# Patient Record
Sex: Female | Born: 1960 | Race: White | Hispanic: No | Marital: Married | Smoking: Never smoker
Health system: Southern US, Community
[De-identification: ages and names within clinical notes are randomized; demographics above are authoritative.]

## PROBLEM LIST (undated history)

## (undated) DIAGNOSIS — K219 Gastro-esophageal reflux disease without esophagitis: Secondary | ICD-10-CM

## (undated) DIAGNOSIS — G43909 Migraine, unspecified, not intractable, without status migrainosus: Secondary | ICD-10-CM

## (undated) DIAGNOSIS — M542 Cervicalgia: Secondary | ICD-10-CM

## (undated) HISTORY — PX: APPENDECTOMY: SHX54

---

## 1997-12-12 ENCOUNTER — Other Ambulatory Visit: Admission: RE | Admit: 1997-12-12 | Discharge: 1997-12-12 | Payer: Self-pay | Admitting: *Deleted

## 1997-12-12 ENCOUNTER — Other Ambulatory Visit: Admission: RE | Admit: 1997-12-12 | Discharge: 1997-12-12 | Payer: Self-pay | Admitting: Obstetrics and Gynecology

## 2009-07-14 ENCOUNTER — Encounter: Admission: RE | Admit: 2009-07-14 | Discharge: 2009-07-14 | Payer: Self-pay | Admitting: Unknown Physician Specialty

## 2010-12-24 IMAGING — CR DG ABDOMEN 2V
3 series · 3 of 3 positions shown · non-contrast
Comparison: None.

CLINICAL DATA: Abdominal pain, chronic diarrhea and nausea.

ABDOMEN - 2 VIEW

[view not recorded (1 of 3)]
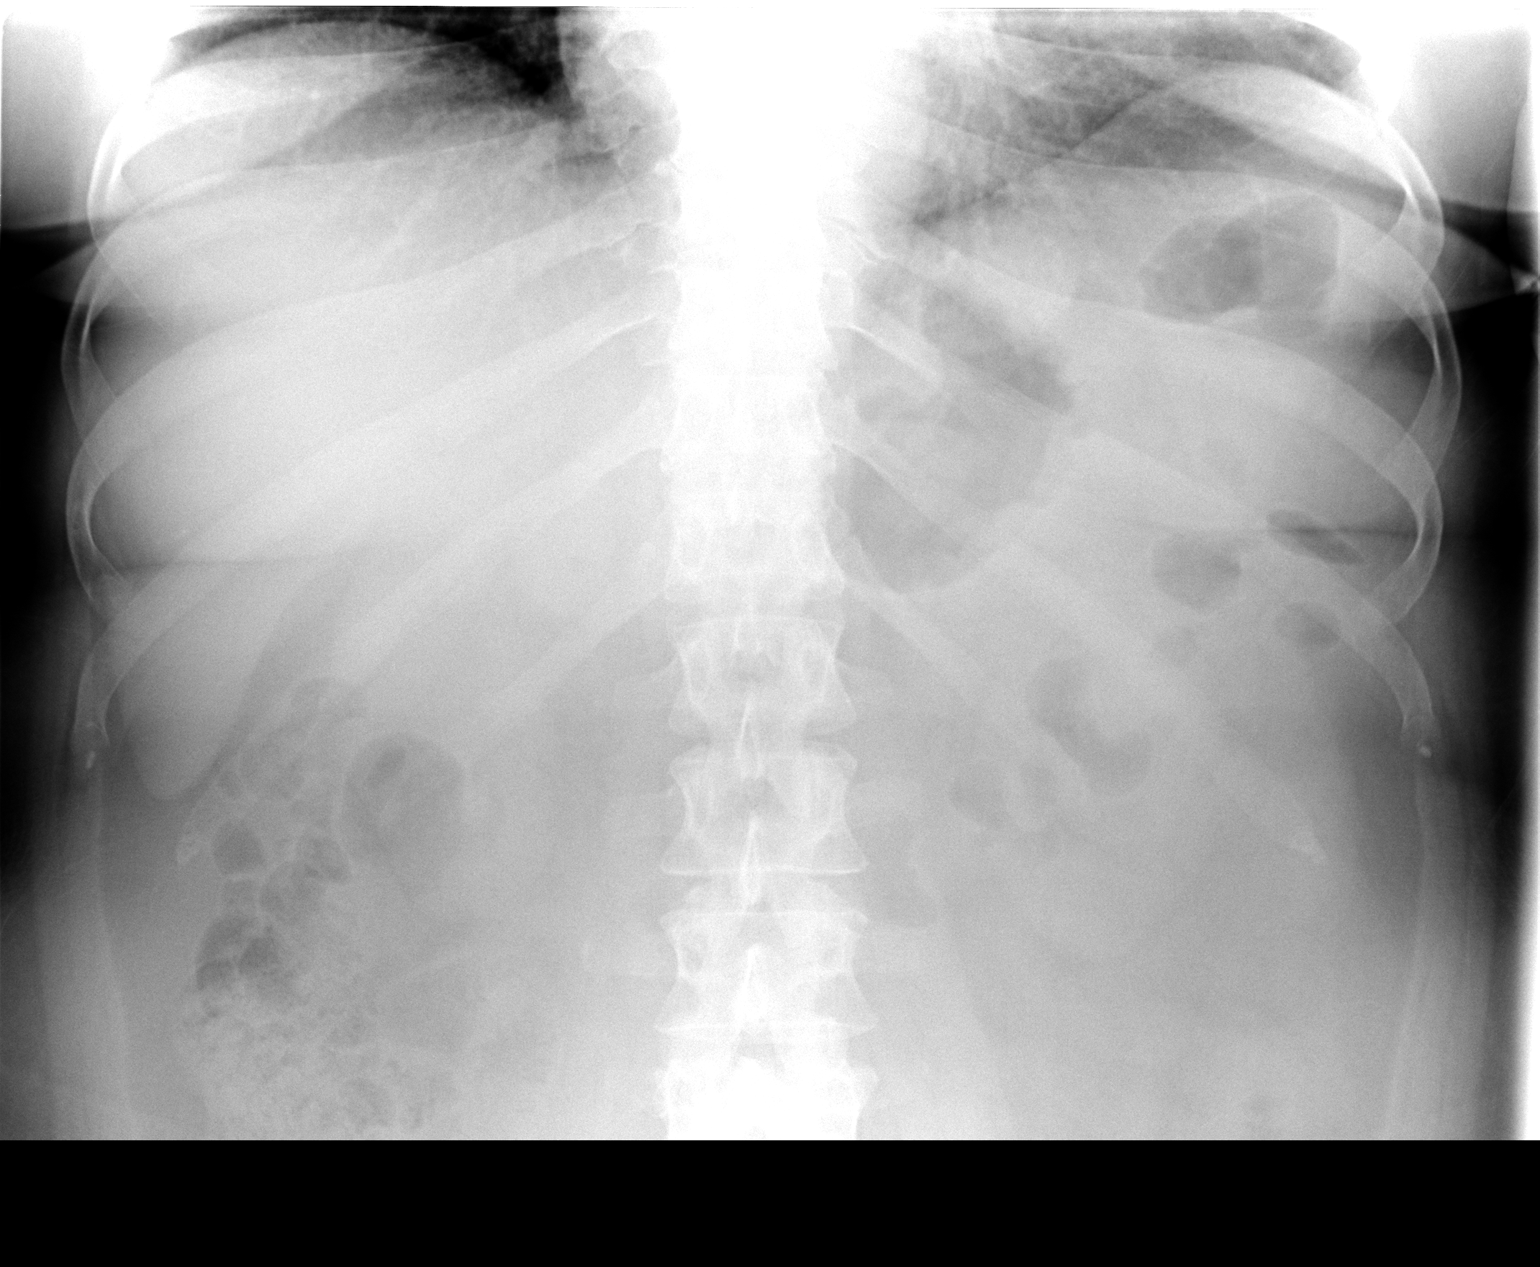

[view not recorded (2 of 3)]
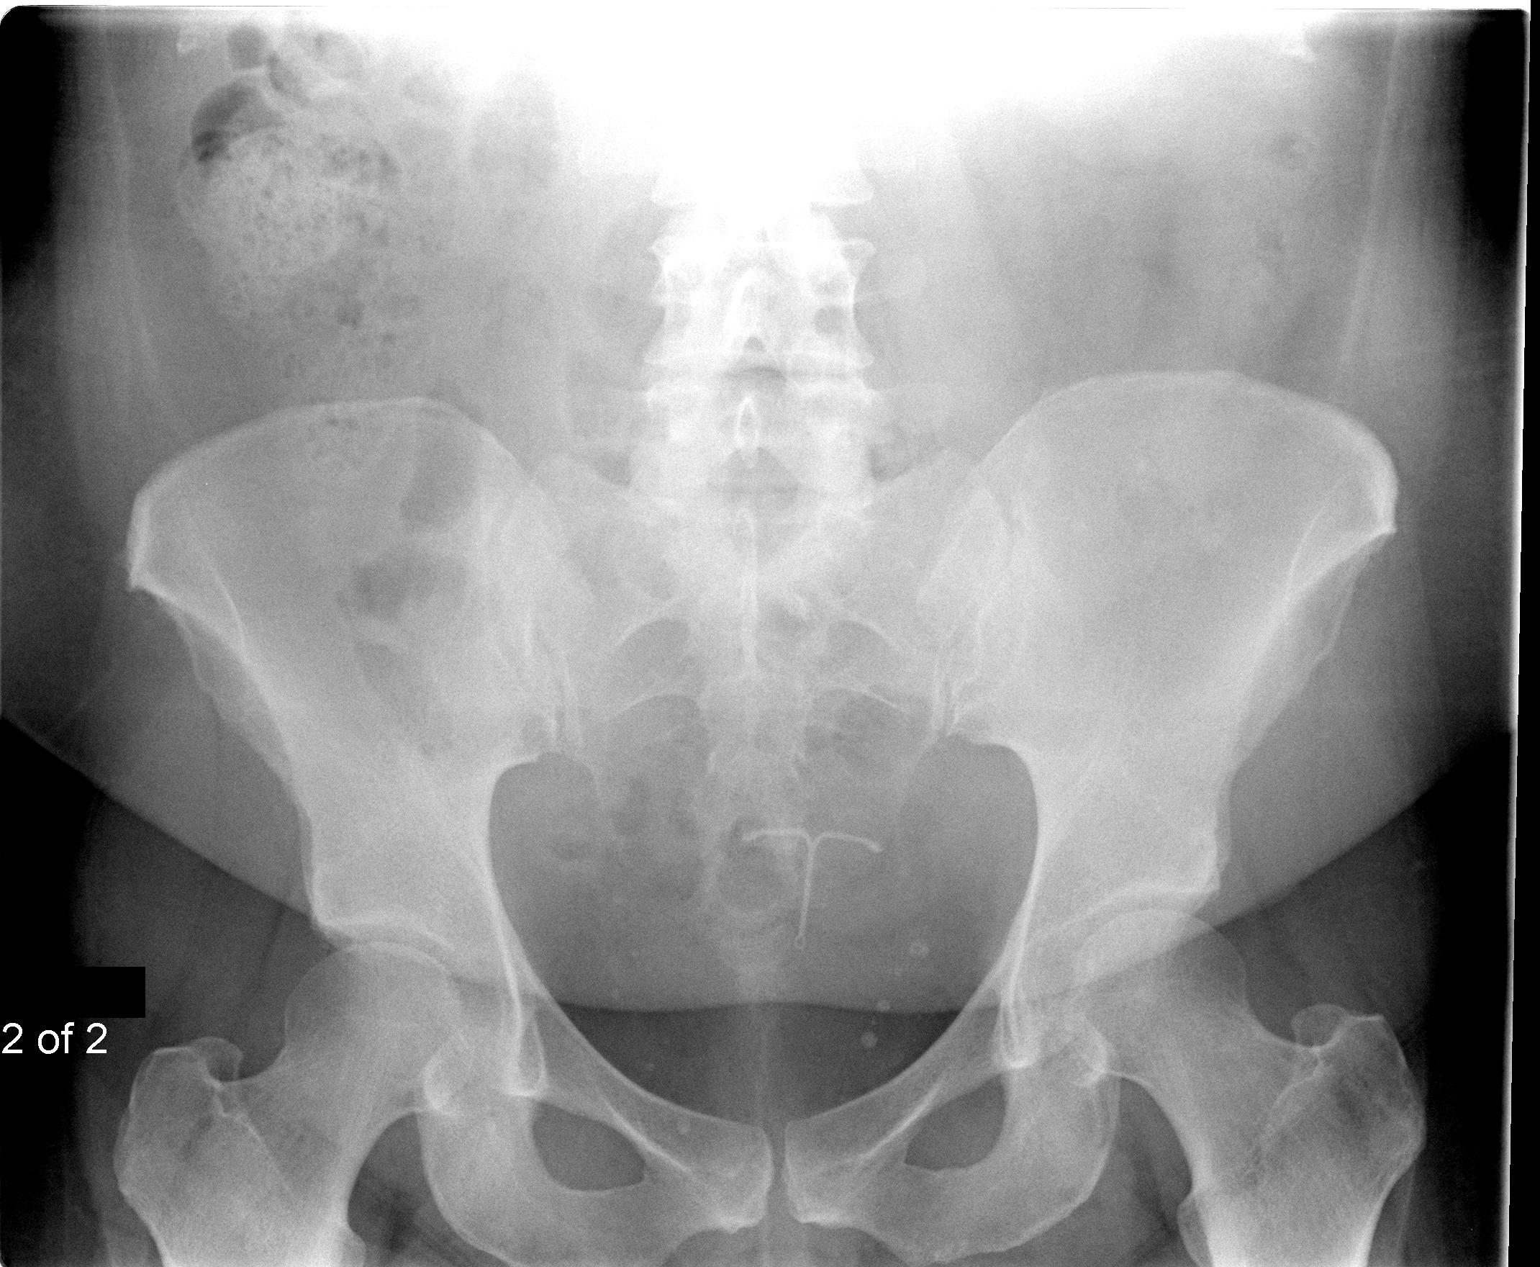

[view not recorded (3 of 3)]
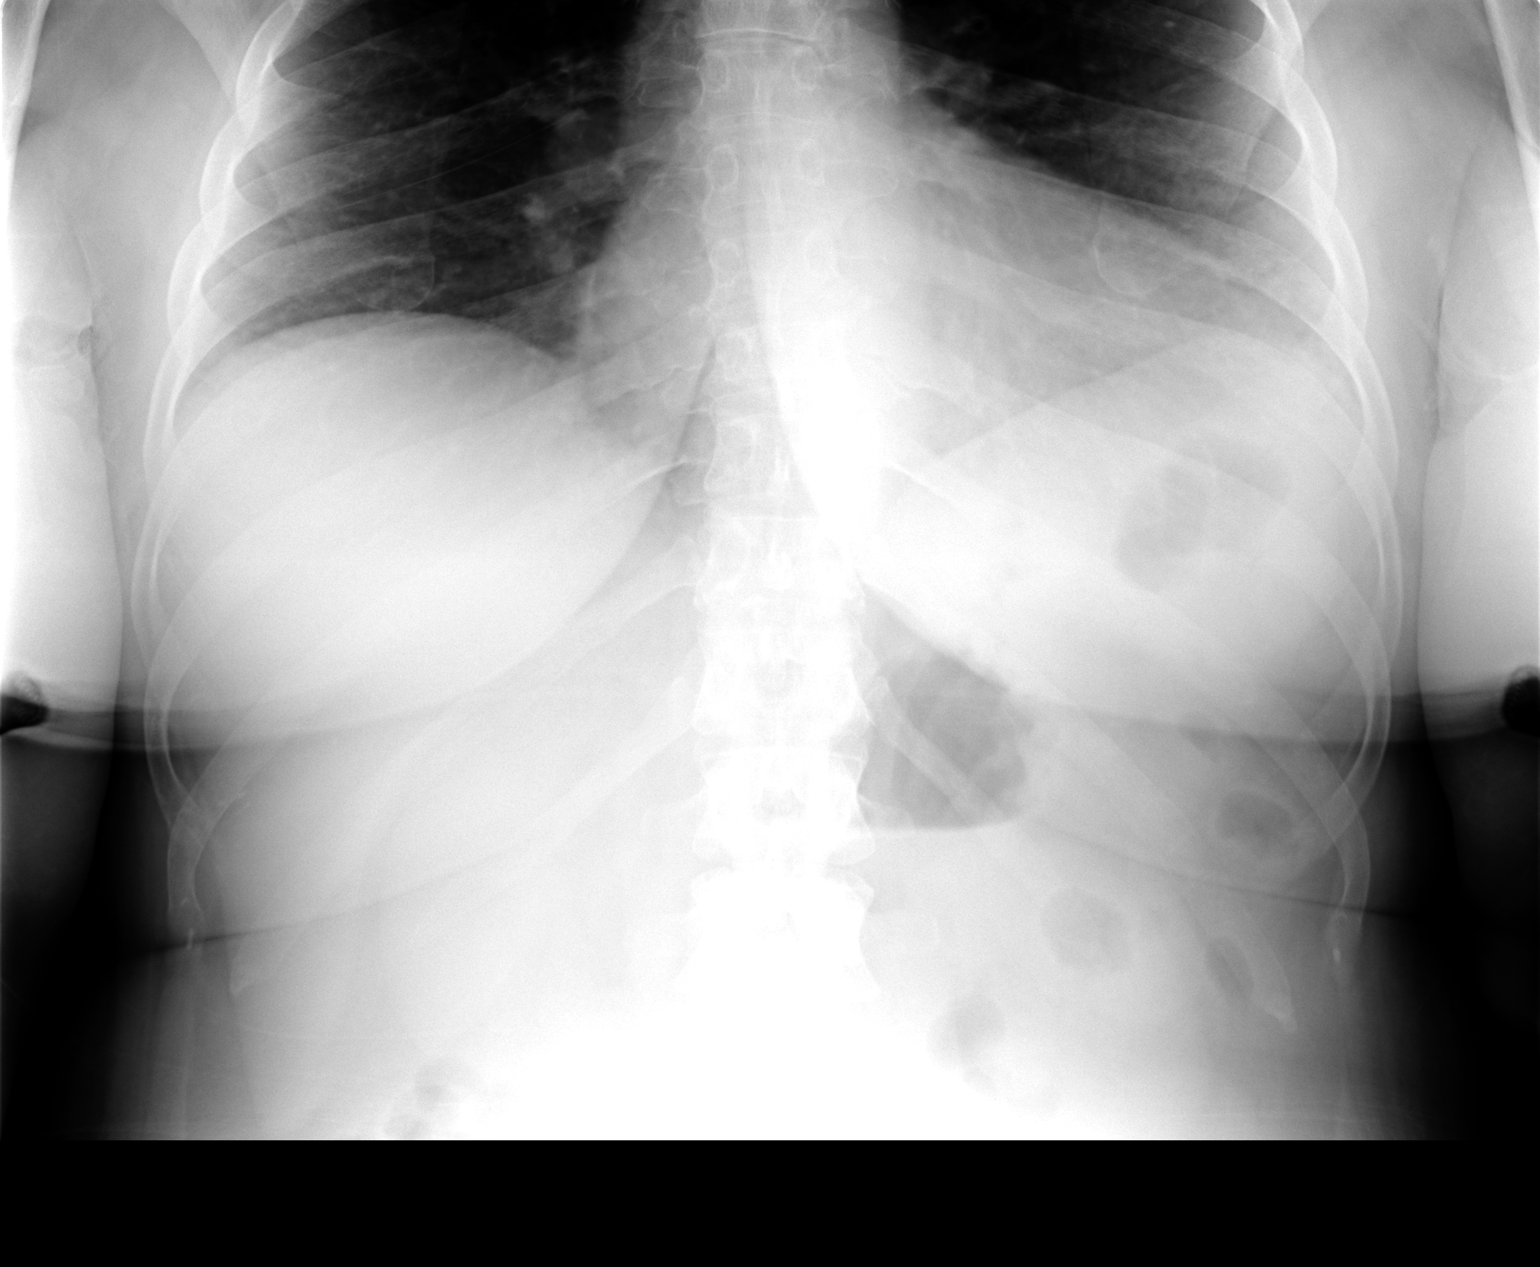

[3 of 3 positions shown; findings below may reference images not displayed]

FINDINGS: Gas and stool are scattered in the colon.  No small bowel
dilatation.  Intrauterine contraceptive device is noted.  There are
phleboliths in the anatomic pelvis.
IMPRESSION: No acute findings.

## 2011-11-16 ENCOUNTER — Ambulatory Visit (INDEPENDENT_AMBULATORY_CARE_PROVIDER_SITE_OTHER): Payer: 59 | Admitting: Psychology

## 2011-11-16 ENCOUNTER — Encounter (HOSPITAL_COMMUNITY): Payer: Self-pay | Admitting: Psychology

## 2011-11-16 DIAGNOSIS — F4322 Adjustment disorder with anxiety: Secondary | ICD-10-CM

## 2011-11-16 NOTE — Progress Notes (Signed)
Presenting Problem Chief Complaint: The patient reports one year ago her husband had an opportunity for a promotion that would require them to move to [place], [place] and up until they produced the offer she said she would not let fear drive her.  She reports when the offer came through she broke down and was unable to do it.  She reports part of it is her introversion but she realized that it is the fear of everything.  He did not accept that job because of her reaction.  She reports there has been no resentment and he is very supportive but he feels stymied at work  Because he needs to move to get a promotion.  She reports she's pretty happy with herself but society needs her to be something else.  The patient identifies herself as obese and that it is an issue.  She has gained about 20 more pounds in the past three years.  She has lived in [place] since 1993 and she has been working at home.  She reports that society has changed so much and she notices that people are overly emotional and this is not comfortable for her. Patient has trouble initiating interaction and is able to do so but doesn't want to.  She feels disappointed in herself and a little guilt though her husband doesn't hold her responsible for holding him back.  What are the main stressors in your life right now, how long? 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 She has now been given another assignment that includes writing the [place] edition of [place] as well as [place].  Previous mental health services Have you ever been treated for a mental health problem, when, where, by whom? Yes  Fleeting suicdial thinking as adolescent on one occasion.  She worked on Brewing technologist for restful sleep about 24 years ago.  She sought therapy to deal with migraines and practiced relaxation.  Are you  currently seeing a therapist or counselor, counselor's name? No   Have you ever had a mental health hospitalization, how many times, length of stay? No   Have you ever been treated with medication, name, reason, response? No  Have you ever had suicidal thoughts or attempted suicide, when, how?    Risk factors for Suicide Demographic factors:  Caucasian Current mental status: None Loss factors: None Historical factors: None Risk Reduction factors: Employed, Living with another person, especially a relative and Positive coping skills or problem solving skills Clinical factors:  None Cognitive features that contribute to risk: None    SUICIDE RISK:  Minimal: No identifiable suicidal ideation.  Patients presenting with no risk factors but with morbid ruminations; may be classified as minimal risk based on the severity of the depressive symptoms  Medical history Medical treatment and/or problems, explain: Yes Graves Disease in remission (with medication), Obesity, Spinal issues Do you have any issues with chronic pain?  No  Name of primary care physician/last physical exam: Dr. Harl Bowie.  Allergies: Yes Medication, reactions? Sulfa   Current medications: See medication record: in addition, [place] Adrenal two tabs once per day Prescribed by: Dr. Harl Bowie Is there any history of mental health problems or substance abuse in your family, whom? No  Has anyone in your family been hospitalized, who, where, length of stay? No  Social/family  history Have you been married, how many times?  One time for 25 years  Do you have children?  One son, [place] (goes by [place]) almost 24- he's very logical like me.  How many pregnancies have you had?  One pregnancy  Who lives in your current household? She lives in [place] in a single family home with her husband and her 'two spoiled dogs'.  Military history: No   Religious/spiritual involvement:  What religion/faith base are you? The  patient attends a [employer], [place].  Family of origin (childhood history)  Where were you born? [place], her father was in the [place] Where did you grow up? She grew up in [place]; she lived there her entire childhood after father completed his service. How many different homes have you lived? Six different homes Describe the atmosphere of the household where you grew up: mom is like me sorta, she is logical but she likes to be more social.  Her father had a scary quick temper. Do you have siblings, step/half siblings, list names, relation, sex, age? Yes [place] is 5 in September; they talk on phone occasionally.  Are your parents separated/divorced, when and why? No, there was a time when her mothe thought about leaving but her father got cancer; she was in college at the time.  Are your parents alive? Yes mother is living and very healthy; father died when he was 28 of mutliple myloma when the patient was 39.  Social supports (personal and professional): husband, mother, or sister  Education How many grades have you completed? post college graduate work or degree Juris Doctorate Did you have any problems in school, what type? No  Medications prescribed for these problems? 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   Legal history   Trauma/Abuse history: Have you ever been exposed to any form of abuse, what type? No   Have you ever been exposed to something traumatic, describe? No   Substance use Do you use Caffeine? Yes Type, frequency? Coffee, 5 hour energy- one per day  Do you use Nicotine? No  Do you use Alcohol? Yes Type, frequency? Glass of wine or martini about three times per week.  How old were you went you first tasted alcohol? 16 Was this accepted by your family? No, until she turned 47  When was your last drink, type, how much? Glass of wine over weekend.  Have you ever used illicit drugs or taken more  than prescribed, type, frequency, date of last usage? Yes occasional marijuana use in college, tried powder cocaine one time in college- 'I really liked it so I decided never to try it again'.  Mental Status: General Appearance [place]:  Neat Eye Contact:  Good Motor Behavior:  Normal Speech:  Normal Level of Consciousness:  Alert Mood:  Euthymic Affect:  Appropriate Anxiety Level:  None Thought Process:  Coherent and Relevant Thought Content:  WNL Perception:  Normal Judgment:  Good Insight:  Present Cognition:  Orientation time, place and person  Diagnosis AXIS I Adjustment Disorder with Anxiety  AXIS II No diagnosis  AXIS III No past medical history on file.  AXIS IV problems related to social environment  AXIS V 61-70 mild symptoms   Plan: The main goal she has for coming her she needs to have the social skills to get another job.  She also experiences significant anxiety related to the thought of relocating with her husband for his job opportunities.  Recommend minimum of four visits to increase coping skills to deal with anxiety and social interactions.  _________________________________________           Magnus Sinning. Charlean Merl, [place] LPC/ Date

## 2011-11-17 DIAGNOSIS — F4322 Adjustment disorder with anxiety: Secondary | ICD-10-CM | POA: Insufficient documentation

## 2011-12-02 ENCOUNTER — Ambulatory Visit (INDEPENDENT_AMBULATORY_CARE_PROVIDER_SITE_OTHER): Payer: 59 | Admitting: Psychology

## 2011-12-02 ENCOUNTER — Encounter (HOSPITAL_COMMUNITY): Payer: Self-pay | Admitting: Psychology

## 2011-12-02 DIAGNOSIS — F4322 Adjustment disorder with anxiety: Secondary | ICD-10-CM

## 2011-12-02 NOTE — Progress Notes (Signed)
   THERAPIST PROGRESS NOTE  Session Time: 205- 250 pm  Participation Level: Active  Behavioral Response: NeatAlertEuthymic  Type of Therapy: Individual Therapy  Treatment Goals addressed: Anxiety and Coping  Interventions: Solution Focused, Strength-based, Psychosocial Skills: goal setting, coping, anxiety and Supportive  Summary: [place] is a 51 y.o. female who presents as pleasant and easily engaged.  This counselor and the patient worked on Optometrist of her treatment plan.  Suicidal/Homicidal: No  Plan: Return again in 2 weeks.  Diagnosis: Axis I: Adjustment Disorder with Anxiety    Axis II: No diagnosis    Salley Scarlet, [facility] 12/02/2011

## 2011-12-08 ENCOUNTER — Telehealth (HOSPITAL_COMMUNITY): Payer: Self-pay | Admitting: Psychology

## 2011-12-08 NOTE — Telephone Encounter (Signed)
Telephone call to [place] to provide information on career testing as discussed in last session.  Provided information website of www.[email or web address].org for her to investigate.

## 2011-12-16 ENCOUNTER — Ambulatory Visit (INDEPENDENT_AMBULATORY_CARE_PROVIDER_SITE_OTHER): Payer: 59 | Admitting: Psychology

## 2011-12-16 DIAGNOSIS — F4322 Adjustment disorder with anxiety: Secondary | ICD-10-CM

## 2011-12-20 ENCOUNTER — Encounter (HOSPITAL_COMMUNITY): Payer: Self-pay | Admitting: Psychology

## 2011-12-20 NOTE — Progress Notes (Signed)
   THERAPIST PROGRESS NOTE  Session Time: 210- 313 pm  Participation Level: Active  Behavioral Response: NeatAlertEuthymic  Type of Therapy: Individual Therapy  Treatment Goals addressed: Anxiety, Communication: in relationships and Coping  Interventions: Solution Focused, Strength-based, Psychosocial Skills: anxiety, coping, communicating in public and Supportive  Summary: [place] is a 51 y.o. female who presents as pleasant and easily engaged.  She reports that she attended two [employer] and plans to join at the next meeting.  The patient reports that her neighbor is a member and that this was helpful when going to know someone.  She learned about another service club that she would be interested while she was at the meeting and thinks that this will be helpful for her to secure relationships in the community.  The patient was unsuccessful in her attempt to go the the careerkey.org web site and questioned if it was the web site or her computer connection; I went to the web site and it was functional so she will check out her computer/connection.  The patient isn't ready to make a career change though admittedly is bored in the job she has held for years.  She is concerned that the publication will close and that she will have to seek employment and would like to try a new field.  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  I suggested that it doesn't sound like a struggling publication but she is worried that they will no longer have the paper because of the internet.  I reminded the patient that this would not change the need for her to write and she agreed this was logical.  The patient has anxiety about the potential for losing her job though at this time it is unfounded.  I suggested the patient work to live in the moment and not predict what will occur at  a future point in time.  She is liberal in her beliefs and was nervous about the potential for her husband to be offered another job in a very conservative state/place as recently this occurred and she refused to go; he turned down the position as a result.  The patient reports the demographic of where her husband was to work was average weight females and she is obese and she wouldn't fit in as a result.  She also knew the city to be ultra conservative and she believed she would not be happy there.  I suggested that she needed to talk with her spouse about her concerns. I also suggested that she would likely not be the only obese person in the area or the only liberal though she believes this to be true.  Suicidal/Homicidal: No  Plan: Return again in 2-3 weeks.  Diagnosis: Axis I: Adjustment Disorder with Anxiety    Axis II: No diagnosis    Salley Scarlet, [facility] 12/20/2011

## 2019-05-12 ENCOUNTER — Other Ambulatory Visit: Payer: Self-pay

## 2019-05-12 ENCOUNTER — Encounter: Payer: Self-pay | Admitting: Emergency Medicine

## 2019-05-12 ENCOUNTER — Emergency Department (INDEPENDENT_AMBULATORY_CARE_PROVIDER_SITE_OTHER)
Admission: EM | Admit: 2019-05-12 | Discharge: 2019-05-12 | Disposition: A | Discharge status: Home / Self Care | Payer: Managed Care, Other (non HMO) | Source: Home / Self Care | Attending: Emergency Medicine | Admitting: Emergency Medicine

## 2019-05-12 DIAGNOSIS — Z23 Encounter for immunization: Secondary | ICD-10-CM

## 2019-05-12 DIAGNOSIS — N3000 Acute cystitis without hematuria: Secondary | ICD-10-CM

## 2019-05-12 DIAGNOSIS — T23012A Burn of unspecified degree of left thumb (nail), initial encounter: Secondary | ICD-10-CM

## 2019-05-12 DIAGNOSIS — R300 Dysuria: Secondary | ICD-10-CM

## 2019-05-12 DIAGNOSIS — T25022A Burn of unspecified degree of left foot, initial encounter: Secondary | ICD-10-CM

## 2019-05-12 DIAGNOSIS — T23122A Burn of first degree of single left finger (nail) except thumb, initial encounter: Secondary | ICD-10-CM

## 2019-05-12 LAB — POCT URINALYSIS DIP (MANUAL ENTRY)
Bilirubin, UA: NEGATIVE
Glucose, UA: NEGATIVE mg/dL
Ketones, POC UA: NEGATIVE mg/dL
Nitrite, UA: POSITIVE — AB
Protein Ur, POC: NEGATIVE mg/dL
Spec Grav, UA: 1.015 (ref 1.010–1.025)
Urobilinogen, UA: 0.2 E.U./dL
pH, UA: 7 (ref 5.0–8.0)

## 2019-05-12 MED ORDER — CEPHALEXIN 500 MG PO CAPS: 500.0000 mg | ORAL_CAPSULE | Freq: Two times a day (BID) | ORAL | 0 refills | Status: DC

## 2019-05-12 MED ORDER — TETANUS-DIPHTH-ACELL PERTUSSIS 5-2.5-18.5 LF-MCG/0.5 IM SUSP
0.5000 mL | Freq: Once | INTRAMUSCULAR | Status: AC
  Administered 2019-05-12: 10:00:00 0.5 mL via INTRAMUSCULAR

## 2019-05-12 NOTE — Discharge Instructions (Signed)
Take antibiotics twice a day. Clean burns with soap and water followed by application of bacitracin ointment. Culture has been done and we will contact you with culture results. Please continue Azo-Standard.
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
## 2019-05-12 NOTE — ED Triage Notes (Signed)
Patient c/o frequency, urgency, dysuria this morning, took an AZO to help with discomfort.

## 2019-05-13 LAB — URINE CULTURE
MICRO NUMBER:: 1231580
SPECIMEN QUALITY:: ADEQUATE

## 2019-05-14 ENCOUNTER — Telehealth: Payer: Self-pay

## 2019-05-14 NOTE — Telephone Encounter (Signed)
Left message on VM to have urine rechecked if sx do not resolve.

## 2020-07-08 ENCOUNTER — Emergency Department (INDEPENDENT_AMBULATORY_CARE_PROVIDER_SITE_OTHER)
Admission: EM | Admit: 2020-07-08 | Discharge: 2020-07-08 | Disposition: A | Discharge status: Home / Self Care | Payer: Managed Care, Other (non HMO) | Source: Home / Self Care

## 2020-07-08 ENCOUNTER — Other Ambulatory Visit: Payer: Self-pay

## 2020-07-08 ENCOUNTER — Encounter: Payer: Self-pay | Admitting: Emergency Medicine

## 2020-07-08 DIAGNOSIS — S81811A Laceration without foreign body, right lower leg, initial encounter: Secondary | ICD-10-CM

## 2020-07-08 HISTORY — DX: Cervicalgia: M54.2

## 2020-07-08 HISTORY — DX: Migraine, unspecified, not intractable, without status migrainosus: G43.909

## 2020-07-08 HISTORY — DX: Gastro-esophageal reflux disease without esophagitis: K21.9

## 2020-07-08 NOTE — Discharge Instructions (Signed)
Use ice to area Keep elevated to reduce pain and swelling  Take aleve or ibuprofen for pain Watch for infection - redness, swelling, pain Keep wrapped Keep clean and dry  Stitches out in 10 days

## 2020-07-08 NOTE — ED Provider Notes (Signed)
[facility]    CSN: [id number] Arrival date & time: 07/08/20  1627      History   Chief Complaint Chief Complaint  Patient presents with  . Leg Injury    HPI Lynn Villa is a 60 y.o. female.   HPI  Patient was walking up a set of wooden steps and tripped. Hit her shin forcibly on the edge of the step. Now she has a laceration on her right shin. This needs to be repaired. She can fully weight-bear The pain is tolerable   Past Medical History:  Diagnosis Date  . GERD (gastroesophageal reflux disease)   . Migraines   . Neck pain     Patient Active Problem List   Diagnosis Date Noted  . Adjustment disorder with anxiety 11/17/2011    Past Surgical History:  Procedure Laterality Date  . APPENDECTOMY      OB History   No obstetric history on file.      Home Medications    Prior to Admission medications   Medication Sig Start Date End Date Taking? Authorizing Provider  zolpidem (AMBIEN CR) 12.5 MG CR tablet Take 12.5 mg by mouth at bedtime as needed for sleep.   Yes [provider]  Cyclobenzaprine HCl (FLEXERIL PO) Take by mouth. Last use over the weekend.    Lasandra Beech, MD  Lansoprazole (PREVACID PO) Take by mouth daily.    [provider]  Multiple Vitamins-Minerals (CENTRUM FLAVOR BURST PO) Take by mouth 4 (four) times daily.    [provider]  Rizatriptan Benzoate (MAXALT PO) Take by mouth.    Lasandra Beech, MD    Family History No family history on file.  Social History Social History   Tobacco Use  . Smoking status: Never Smoker  . Smokeless tobacco: Never Used  Substance Use Topics  . Alcohol use: Yes    Alcohol/week: 3.0 standard drinks    Types: 3 drink(s) per week    Comment: glass of wine or a martini  . Drug use: No     Allergies   Reglan [metoclopramide] and Sulfa antibiotics   Review of Systems Review of Systems See HPI  Physical Exam Triage Vital Signs ED Triage  Vitals  Enc Vitals Group     BP 07/08/20 1643 135/80     Pulse Rate 07/08/20 1643 88     Resp 07/08/20 1643 16     Temp 07/08/20 1643 98.8 F (37.1 C)     Temp Source 07/08/20 1643 Oral     SpO2 07/08/20 1643 99 %     Weight 07/08/20 1644 175 lb (79.4 kg)     Height 07/08/20 1644 5' 7.5" (1.715 m)     Head Circumference --      Peak Flow --      Pain Score 07/08/20 1644 1     Pain Loc --      Pain Edu? --      Excl. in GC? --    No data found.  Updated Vital Signs BP 135/80 (BP Location: Left Arm)   Pulse 88   Temp 98.8 F (37.1 C) (Oral)   Resp 16   Ht 5' 7.5" (1.715 m)   Wt 79.4 kg   SpO2 99%   BMI 27.00 kg/m       Physical Exam Constitutional:      General: She is not in acute distress.    Appearance: She is well-developed and well-nourished.  Comments: Pleasant. Overweight. Mask is in place  HENT:     Head: Normocephalic and atraumatic.     Mouth/Throat:     Mouth: Oropharynx is clear and moist.  Eyes:     Conjunctiva/sclera: Conjunctivae normal.     Pupils: Pupils are equal, round, and reactive to light.  Cardiovascular:     Rate and Rhythm: Normal rate.  Pulmonary:     Effort: Pulmonary effort is normal. No respiratory distress.  Abdominal:     General: There is no distension.     Palpations: Abdomen is soft.  Musculoskeletal:        General: No edema. Normal range of motion.     Cervical back: Normal range of motion.  Skin:    General: Skin is warm and dry.     Comments: V shaped laceration on the front of the right shoulder. No bony tenderness. The flap is quite thin. Each edge is 5 cm long  Neurological:     Mental Status: She is alert.  Psychiatric:        Behavior: Behavior normal.      UC Treatments / Results  Labs (all labs ordered are listed, but only abnormal results are displayed) Labs Reviewed - No data to display  EKG   Radiology No results found.  Procedures Laceration Repair  Date/Time: 07/08/2020 6:16  PM Performed by: Eustace Moore, MD Authorized by: Eustace Moore, MD   Consent:    Consent obtained:  Verbal   Consent given by:  Patient   Risks, benefits, and alternatives were discussed: yes     Risks discussed:  Infection, poor cosmetic result and pain Universal protocol:    Patient identity confirmed:  Verbally with patient and arm band Laceration details:    Location:  Leg   Leg location:  R lower leg   Length (cm):  10   Depth (mm):  20 Pre-procedure details:    Preparation:  Patient was prepped and draped in usual sterile fashion Exploration:    Hemostasis achieved with:  Direct pressure   Wound extent: foreign bodies/material     Foreign bodies/material:  Wood splinters removed   Contaminated: yes   Treatment:    Area cleansed with:  Povidone-iodine   Amount of cleaning:  Extensive   Irrigation solution:  Sterile saline   Irrigation volume:  50   Irrigation method:  Syringe   Visualized foreign bodies/material removed: yes     Debridement:  Minimal Skin repair:    Repair method:  Sutures   Suture size:  3-0   Suture material:  Nylon   Suture technique:  Simple interrupted   Number of sutures:  8 Repair type:    Repair type:  Simple Post-procedure details:    Dressing:  Bulky dressing   Procedure completion:  Tolerated   (including critical care time)  Medications Ordered in UC Medications - No data to display  Initial Impression / Assessment and Plan / UC Course  I have reviewed the triage vital signs and the nursing notes.  Pertinent labs & imaging results that were available during my care of the patient were reviewed by me and considered in my medical decision making     Wound care discussed Final Clinical Impressions(s) / UC Diagnoses   Final diagnoses:  Laceration of right lower leg, initial encounter     Discharge Instructions     Use ice to area Keep elevated to reduce pain and swelling  Take aleve or ibuprofen  for  pain Watch for infection - redness, swelling, pain Keep wrapped Keep clean and dry  Stitches out in 10 days    ED Prescriptions    None     PDMP not reviewed this encounter.   Eustace Moore, MD 07/08/20 [phone]

## 2020-07-08 NOTE — ED Triage Notes (Signed)
Patient slipped on wooden steps in home within past hours and lacerated right shin; has paper towel covering with tape. Had tdap booster 2020. Has had all covid vaccinations/booster.

## 2020-07-10 ENCOUNTER — Telehealth: Payer: Self-pay | Admitting: Emergency Medicine

## 2020-07-10 NOTE — Telephone Encounter (Signed)
0900-Call from patient regarding dressing change. Patient stated the wound had bled through the dressing  & she was not able to change the  dressing last night because it was stuck in place. Per pt the wound is on her shin. Pt directed to soak shin in warm water and soap to loosen dressing. Pt verbalized an understanding. Call back at 1247 from patient to state she had been soaking it in the tub for 10 min and dressing had not come off. RN instructed patient to peel up edges of dressing. If patient still unable to remove dressing she can return to [facility] for wound recheck- patient verbalized an understanding.

## 2020-07-18 ENCOUNTER — Other Ambulatory Visit: Payer: Self-pay

## 2020-07-18 ENCOUNTER — Emergency Department (INDEPENDENT_AMBULATORY_CARE_PROVIDER_SITE_OTHER)
Admission: RE | Admit: 2020-07-18 | Discharge: 2020-07-18 | Disposition: A | Discharge status: Home / Self Care | Payer: Managed Care, Other (non HMO) | Source: Ambulatory Visit | Attending: Family Medicine | Admitting: Family Medicine

## 2020-07-18 VITALS — BP 114/74 | HR 88 | Temp 98.1°F | Resp 15

## 2020-07-18 DIAGNOSIS — L03115 Cellulitis of right lower limb: Secondary | ICD-10-CM | POA: Diagnosis not present

## 2020-07-18 DIAGNOSIS — Z5189 Encounter for other specified aftercare: Secondary | ICD-10-CM

## 2020-07-18 MED ORDER — DOXYCYCLINE HYCLATE 100 MG PO CAPS: ORAL_CAPSULE | ORAL | 0 refills | Status: AC

## 2020-07-18 NOTE — Discharge Instructions (Addendum)
Change dressing daily.  Keep wound clean and dry.  Return for any signs of infection (or follow-up with family doctor):  Increasing redness, swelling, pain, heat, drainage, etc. Return in about 5 to 7 days for suture removal.

## 2020-07-18 NOTE — ED Provider Notes (Signed)
[facility]    CSN: [id number] Arrival date & time: 07/18/20  1558      History   Chief Complaint Chief Complaint  Patient presents with  . Appointment  . Suture / Staple Removal    HPI Lynn Villa is a 60 y.o. female.   Patient returns for possible suture removal from laceration repair to right pre-tibial area 10 days ago.  She reports minimal pain but the wound continues to drain slightly.  The history is provided by the patient.    Past Medical History:  Diagnosis Date  . GERD (gastroesophageal reflux disease)   . Migraines   . Neck pain     Patient Active Problem List   Diagnosis Date Noted  . Adjustment disorder with anxiety 11/17/2011    Past Surgical History:  Procedure Laterality Date  . APPENDECTOMY      OB History   No obstetric history on file.      Home Medications    Prior to Admission medications   Medication Sig Start Date End Date Taking? Authorizing Provider  doxycycline (VIBRAMYCIN) 100 MG capsule Take one cap PO Q12hr with food. 07/18/20  Yes Lattie Haw, MD  Cyclobenzaprine HCl (FLEXERIL PO) Take by mouth. Last use over the weekend.    Lasandra Beech, MD  Lansoprazole (PREVACID PO) Take by mouth daily.    [provider]  Multiple Vitamins-Minerals (CENTRUM FLAVOR BURST PO) Take by mouth 4 (four) times daily.    [provider]  Rizatriptan Benzoate (MAXALT PO) Take by mouth.    Lasandra Beech, MD  zolpidem (AMBIEN CR) 12.5 MG CR tablet Take 12.5 mg by mouth at bedtime as needed for sleep.    [provider]    Family History Family History  Problem Relation Age of Onset  . Healthy Mother   . Cancer Father   . Healthy Sister     Social History Social History   Tobacco Use  . Smoking status: Never Smoker  . Smokeless tobacco: Never Used  Substance Use Topics  . Alcohol use: Yes    Alcohol/week: 3.0 standard drinks    Types: 3 drink(s) per week    Comment: glass of wine  or a martini  . Drug use: No     Allergies   Reglan [metoclopramide] and Sulfa antibiotics   Review of Systems Review of Systems  Constitutional: Negative for appetite change, chills, diaphoresis, fatigue and fever.  Skin: Positive for color change and wound.     Physical Exam Triage Vital Signs ED Triage Vitals  Enc Vitals Group     BP 07/18/20 1607 114/74     Pulse Rate 07/18/20 1607 88     Resp 07/18/20 1607 15     Temp 07/18/20 1607 98.1 F (36.7 C)     Temp Source 07/18/20 1607 Oral     SpO2 07/18/20 1607 99 %     Weight --      Height --      Head Circumference --      Peak Flow --      Pain Score 07/18/20 1609 2     Pain Loc --      Pain Edu? --      Excl. in GC? --    No data found.  Updated Vital Signs BP 114/74 (BP Location: Left Arm)   Pulse 88   Temp 98.1 F (36.7 C) (Oral)   Resp 15   SpO2 99%  Visual Acuity Right Eye Distance:   Left Eye Distance:   Bilateral Distance:    Right Eye Near:   Left Eye Near:    Bilateral Near:     Physical Exam Vitals and nursing note reviewed.  Constitutional:      General: She is not in acute distress. Cardiovascular:     Rate and Rhythm: Normal rate.  Pulmonary:     Effort: Pulmonary effort is normal.  Musculoskeletal:       Legs:     Comments: Sutured L-shaped laceration right pre-tibial area has halo of erythema and mild tenderness to palpation.  Central area has a small amount of bloody drainage present.  Neurological:     Mental Status: She is alert.      UC Treatments / Results  Labs (all labs ordered are listed, but only abnormal results are displayed) Labs Reviewed  WOUND CULTURE    EKG   Radiology No results found.  Procedures Procedures (including critical care time)  Medications Ordered in UC Medications - No data to display  Initial Impression / Assessment and Plan / UC Course  I have reviewed the triage vital signs and the nursing notes.  Pertinent labs & imaging  results that were available during my care of the patient were reviewed by me and considered in my medical decision making (see chart for details).    Will delay suture removal for 5 to 7 days. Begin doxycycline.  Wound culture pending. Suggested that patient consider wearing light support hose daytime to optimize skin circulation.    Final Clinical Impressions(s) / UC Diagnoses   Final diagnoses:  Visit for wound check  Cellulitis of right lower extremity     Discharge Instructions     Change dressing daily.  Keep wound clean and dry.  Return for any signs of infection (or follow-up with family doctor):  Increasing redness, swelling, pain, heat, drainage, etc. Return in about 5 to 7 days for suture removal.     ED Prescriptions    Medication Sig Dispense Auth. Provider   doxycycline (VIBRAMYCIN) 100 MG capsule Take one cap PO Q12hr with food. 14 capsule Lattie Haw, MD        Lattie Haw, MD 07/19/20 [phone]

## 2020-07-18 NOTE — ED Notes (Signed)
Suture removal to be done by Dr Cathren Harsh- edges around wound on RLE slightly red w/ small amount of bleeding noted at corner of stitches. Sutures placed here on 07/08/20. Pt denies fever

## 2020-07-20 LAB — WOUND CULTURE

## 2020-07-21 LAB — WOUND CULTURE
MICRO NUMBER:: 11610894
SPECIMEN QUALITY:: ADEQUATE

## 2020-07-29 ENCOUNTER — Emergency Department (INDEPENDENT_AMBULATORY_CARE_PROVIDER_SITE_OTHER)
Admission: RE | Admit: 2020-07-29 | Discharge: 2020-07-29 | Disposition: A | Discharge status: Home / Self Care | Payer: Managed Care, Other (non HMO) | Source: Ambulatory Visit

## 2020-07-29 ENCOUNTER — Other Ambulatory Visit: Payer: Self-pay

## 2020-07-29 VITALS — BP 132/82 | HR 75 | Temp 98.0°F | Resp 18 | Ht 67.0 in | Wt 175.0 lb

## 2020-07-29 DIAGNOSIS — Z4802 Encounter for removal of sutures: Secondary | ICD-10-CM

## 2020-07-29 NOTE — ED Provider Notes (Signed)
[facility]    CSN: [id number] Arrival date & time: 07/29/20  1636      History   Chief Complaint Chief Complaint  Patient presents with   Suture / Staple Removal    HPI Lynn Villa is a 60 y.o. female.   Patient returns for suture removal from laceration right pre-tibial area.  She finished a course of doxycycline and reports she has no pain or drainage from the wound.  The history is provided by the patient.    Past Medical History:  Diagnosis Date   GERD (gastroesophageal reflux disease)    Migraines    Neck pain     Patient Active Problem List   Diagnosis Date Noted   Adjustment disorder with anxiety 11/17/2011    Past Surgical History:  Procedure Laterality Date   APPENDECTOMY      OB History   No obstetric history on file.      Home Medications    Prior to Admission medications   Medication Sig Start Date End Date Taking? Authorizing Provider  Cyclobenzaprine HCl (FLEXERIL PO) Take by mouth. Last use over the weekend.    Lasandra Beech, MD  doxycycline (VIBRAMYCIN) 100 MG capsule Take one cap PO Q12hr with food. 07/18/20   Lattie Haw, MD  Lansoprazole (PREVACID PO) Take by mouth daily.    [provider]  Multiple Vitamins-Minerals (CENTRUM FLAVOR BURST PO) Take by mouth 4 (four) times daily.    [provider]  Rizatriptan Benzoate (MAXALT PO) Take by mouth.    Lasandra Beech, MD  zolpidem (AMBIEN CR) 12.5 MG CR tablet Take 12.5 mg by mouth at bedtime as needed for sleep.    [provider]    Family History Family History  Problem Relation Age of Onset   Healthy Mother    Cancer Father    Healthy Sister     Social History Social History   Tobacco Use   Smoking status: Never Smoker   Smokeless tobacco: Never Used  Building services engineer Use: Never used  Substance Use Topics   Alcohol use: Yes    Alcohol/week: 3.0 standard drinks    Types: 3 drink(s) per week     Comment: glass of wine or a martini   Drug use: No     Allergies   Reglan [metoclopramide] and Sulfa antibiotics   Review of Systems Review of Systems  Constitutional: Negative for chills, diaphoresis and fatigue.  Skin: Negative for color change and rash.     Physical Exam Triage Vital Signs ED Triage Vitals  Enc Vitals Group     BP 07/29/20 1646 132/82     Pulse Rate 07/29/20 1646 75     Resp 07/29/20 1646 18     Temp 07/29/20 1646 98 F (36.7 C)     Temp Source 07/29/20 1646 Oral     SpO2 07/29/20 1646 100 %     Weight 07/29/20 1647 175 lb (79.4 kg)     Height 07/29/20 1647 5\' 7"  (1.702 m)     Head Circumference --      Peak Flow --      Pain Score 07/29/20 1647 0     Pain Loc --      Pain Edu? --      Excl. in GC? --    No data found.  Updated Vital Signs BP 132/82 (BP Location: Right Arm)    Pulse 75    Temp 98 F (  36.7 C) (Oral)    Resp 18    Ht 5\' 7"  (1.702 m)    Wt 79.4 kg    SpO2 100%    BMI 27.41 kg/m   Visual Acuity Right Eye Distance:   Left Eye Distance:   Bilateral Distance:    Right Eye Near:   Left Eye Near:    Bilateral Near:     Physical Exam Constitutional:      General: She is not in acute distress. Eyes:     Pupils: Pupils are equal, round, and reactive to light.  Cardiovascular:     Rate and Rhythm: Normal rate.  Pulmonary:     Effort: Pulmonary effort is normal.  Skin:    General: Skin is warm and dry.     Comments: Wound right lower leg appears well healed.  Mild surrounding erythema but no tenderness to palpation. Sutures removed.  Bacitracin applied.  Neurological:     Mental Status: She is alert.      UC Treatments / Results  Labs (all labs ordered are listed, but only abnormal results are displayed) Labs Reviewed - No data to display  EKG   Radiology No results found.  Procedures Procedures (including critical care time)  Medications Ordered in UC Medications - No data to display  Initial Impression /  Assessment and Plan / UC Course  I have reviewed the triage vital signs and the nursing notes.  Pertinent labs & imaging results that were available during my care of the patient were reviewed by me and considered in my medical decision making (see chart for details).    Well-healed laceration right lower leg.   Final Clinical Impressions(s) / UC Diagnoses   Final diagnoses:  Visit for suture removal   Discharge Instructions   None    ED Prescriptions    None        , MD 07/29/20 1726

## 2020-07-29 NOTE — ED Triage Notes (Signed)
Lower RT leg, sutures put in on 2/22
